# Patient Record
Sex: Female | Born: 1985 | Race: White | Hispanic: No | Marital: Single | Smoking: Never smoker
Health system: Southern US, Community
[De-identification: ages and names within clinical notes are randomized; demographics above are authoritative.]

## PROBLEM LIST (undated history)

## (undated) DIAGNOSIS — D649 Anemia, unspecified: Secondary | ICD-10-CM

## (undated) HISTORY — DX: Anemia, unspecified: D64.9

## (undated) HISTORY — PX: FIBULA FRACTURE SURGERY: SHX947

## (undated) HISTORY — PX: EYE SURGERY: SHX253

---

## 2005-11-26 ENCOUNTER — Ambulatory Visit (HOSPITAL_COMMUNITY): Admission: RE | Admit: 2005-11-26 | Discharge: 2005-11-26 | Payer: Self-pay | Admitting: Orthopedic Surgery

## 2006-07-13 ENCOUNTER — Ambulatory Visit (HOSPITAL_BASED_OUTPATIENT_CLINIC_OR_DEPARTMENT_OTHER): Admission: RE | Admit: 2006-07-13 | Discharge: 2006-07-13 | Payer: Self-pay | Admitting: Orthopedic Surgery

## 2008-03-23 ENCOUNTER — Ambulatory Visit: Payer: Self-pay | Admitting: Family Medicine

## 2008-03-23 DIAGNOSIS — D649 Anemia, unspecified: Secondary | ICD-10-CM | POA: Insufficient documentation

## 2008-03-26 ENCOUNTER — Encounter (INDEPENDENT_AMBULATORY_CARE_PROVIDER_SITE_OTHER): Payer: Self-pay | Admitting: *Deleted

## 2008-03-26 LAB — CONVERTED CEMR LAB
Basophils Relative: 0 % (ref 0–1)
Eosinophils Relative: 1 % (ref 0–5)
Folate: 14.1 ng/mL
HCT: 38.6 % (ref 36.0–46.0)
Hemoglobin: 12 g/dL (ref 12.0–15.0)
MCHC: 31.1 g/dL (ref 30.0–36.0)
Monocytes Absolute: 0.6 10*3/uL (ref 0.1–1.0)
Monocytes Relative: 7 % (ref 3–12)
Neutro Abs: 5.9 10*3/uL (ref 1.7–7.7)
RBC: 5.26 M/uL — ABNORMAL HIGH (ref 3.87–5.11)
Transferrin: 300 mg/dL (ref 212–360)

## 2008-04-10 ENCOUNTER — Ambulatory Visit: Payer: Self-pay | Admitting: Family Medicine

## 2008-04-10 ENCOUNTER — Encounter (INDEPENDENT_AMBULATORY_CARE_PROVIDER_SITE_OTHER): Payer: Self-pay | Admitting: *Deleted

## 2008-04-10 LAB — CONVERTED CEMR LAB
OCCULT 2: NEGATIVE
OCCULT 3: NEGATIVE

## 2011-02-19 ENCOUNTER — Emergency Department (INDEPENDENT_AMBULATORY_CARE_PROVIDER_SITE_OTHER): Payer: No Typology Code available for payment source

## 2011-02-19 ENCOUNTER — Emergency Department (HOSPITAL_BASED_OUTPATIENT_CLINIC_OR_DEPARTMENT_OTHER)
Admission: EM | Admit: 2011-02-19 | Discharge: 2011-02-19 | Disposition: A | Discharge status: Home / Self Care | Payer: No Typology Code available for payment source | Attending: Emergency Medicine | Admitting: Emergency Medicine

## 2011-02-19 DIAGNOSIS — R55 Syncope and collapse: Secondary | ICD-10-CM

## 2011-02-19 LAB — DIFFERENTIAL
Basophils Relative: 0 % (ref 0–1)
Eosinophils Absolute: 0 10*3/uL (ref 0.0–0.7)
Lymphocytes Relative: 8 % — ABNORMAL LOW (ref 12–46)
Monocytes Relative: 4 % (ref 3–12)
Neutro Abs: 9.8 10*3/uL — ABNORMAL HIGH (ref 1.7–7.7)

## 2011-02-19 LAB — COMPREHENSIVE METABOLIC PANEL
Albumin: 4.5 g/dL (ref 3.5–5.2)
Alkaline Phosphatase: 84 U/L (ref 39–117)
BUN: 16 mg/dL (ref 6–23)
GFR calc Af Amer: >60 mL/min (ref >60)
Potassium: 4.5 mEq/L (ref 3.5–5.1)
Total Protein: 8.5 g/dL — ABNORMAL HIGH (ref 6.0–8.3)

## 2011-02-19 LAB — CBC
Hemoglobin: 12.6 g/dL (ref 12.0–15.0)
MCH: 23.6 pg — ABNORMAL LOW (ref 26.0–34.0)
MCV: 71.2 fL — ABNORMAL LOW (ref 78.0–100.0)
RBC: 5.34 MIL/uL — ABNORMAL HIGH (ref 3.87–5.11)

## 2011-02-19 LAB — URINE MICROSCOPIC-ADD ON

## 2011-02-19 LAB — URINALYSIS, ROUTINE W REFLEX MICROSCOPIC
Bilirubin Urine: NEGATIVE
Specific Gravity, Urine: 1.027 (ref 1.005–1.030)
pH: 5.5 (ref 5.0–8.0)

## 2011-02-19 MED ORDER — IOHEXOL 350 MG/ML SOLN
80.0000 mL | Freq: Once | INTRAVENOUS | Status: AC | PRN
  Administered 2011-02-19: 80 mL via INTRAVENOUS

## 2011-04-24 NOTE — Op Note (Signed)
NAMEMarland Kitchen  SAVAYAH, WALTRIP NO.:  [id number]   MEDICAL RECORD NO.:  [id number]          PATIENT TYPE:  AMB   LOCATION:  DAY                          FACILITY:  [facility]   PHYSICIAN:  Leonides Grills, M.D.     DATE OF BIRTH:  03-01-1986   DATE OF PROCEDURE:  11/26/2005  DATE OF DISCHARGE:                                 OPERATIVE REPORT   PREOPERATIVE DIAGNOSES:  1.  Right closed lateral malleolus fracture.  2.  Right distal tib-fib syndesmotic rupture.   POSTOPERATIVE DIAGNOSES:  1.  Right closed lateral malleolus fracture.  2.  Right distal tib-fib syndesmotic rupture.   OPERATION:  1.  Open reduction and internal fixation right lateral malleolus fracture.  2.  Open reduction and internal fixation distal tib-fib syndesmotic rupture.  3.  Stress x-rays right ankle.   ANESTHESIA:  General.   SURGEON:  Leonides Grills, M.D.   ASSISTANT:  Lianne Cure, P.A.   ESTIMATED BLOOD LOSS:  Minimal.   TOURNIQUET TIME:  None.   COMPLICATIONS:  None.   DISPOSITION:  Stable to PR.   INDICATIONS FOR PROCEDURE:  This is a 25 year old female who slipped and  fell this week and sustained the above injury. She was consented to the  above procedure. All risks which include infection, neurovessel injury,  nonunion, malunion, hardware rotation, hardware failure, persistent pain,  worsening pain, stiffness, arthritis, prolonged recovery, possibility  syndesmotic ligaments may require refixation and future hardware removal of  the syndesmotic screws were all explained, questions were encouraged and  answered.   DESCRIPTION OF PROCEDURE:  The patient was brought to the operating room,  placed in supine position. After adequate general endotracheal tube  anesthesia was administered as well as Ancef 1 gram IV piggyback. The right  lower extremity was then prepped and draped in a sterile manner. A  tourniquet was applied but was not used due to a venous tourniquet. The  right lower  extremity was then prepped and draped in a sterile manner. A  longitudinal incision over the lateral malleolus was then made and  dissection was carried down through the skin, hemostasis was obtained. A  fracture site was then encountered. A 10 hole 1/3 tubular plate was applied.  With distraction on the distal fragment, the fibula was pulled out to length  and keyed in anatomically. This required two 2 point Weber reduction clamps.  Once this was anatomic, we applied the 10 hole 1/3 tubular plate and applied  four 3.5 mm fully threaded cortical set screws proximally and two distally.  We then placed two interfragmentary lag screws using a 3.5,  2.5 mm drill  hole respectively. This had excellent purchase and maintenance of the  reduction. We then applied a Kings tong clamp and anatomically reduced  openly the distal tib-fib syndesmosis and then placed two 4.5 mm fully  threaded cortical set screws using a 3.2 mm drill hole respectively with the  ankle in full dorsiflexion. This had excellent purchase __________  correction especially after the [facility] tong clamp was taken off. Of note, we  obtained stress x-rays after  the fibula was fixed and there was clear  widening of the distal tib-fib syndesmosis and this was __________ pattern  as well. We elected to place the syndesmotic screws as well. Final stress x-  rays were obtained AP, lateral and mortis views and showed an anatomic  reduction. Fixation and proposition, no gross motion of the tails within the  ankle mortis especially laterally and no diastasis of the distal tib-fib  syndesmosis. Of note, as stated earlier, the tourniquet was inflated for  about 2 or 3 minutes, there was a venous tourniquet and we deflated it and  we had excellent control of hemostasis throughout the procedure. The wound  was copiously irrigated with normal saline. The subcu was closed with 2-0  Vicryl, skin was closed with 4-0 nylon. A sterile dressing was  applied. A  modified Jones dressing was applied with the ankle in neutral dorsiflexion.  The patient was stable to the PR.      Leonides Grills, M.D.  Electronically Signed     PB/MEDQ  D:  11/26/2005  T:  12/01/2005  Job:  [id number]

## 2011-04-24 NOTE — Op Note (Signed)
NAMEMarland Kitchen  Kelsey, Munoz NO.:  [id number]   MEDICAL RECORD NO.:  [id number]          PATIENT TYPE:  AMB   LOCATION:  DSC                          FACILITY:  [facility]   PHYSICIAN:  Leonides Grills, M.D.     DATE OF BIRTH:  1986-03-09   DATE OF PROCEDURE:  07/13/2006  DATE OF DISCHARGE:                                 OPERATIVE REPORT   SURGEON:  Leonides Grills, M.D.   ASSISTANT:  None.   PREOPERATIVE DIAGNOSIS:  Complication of hardware, right ankle.   POSTOPERATIVE DIAGNOSIS:  Complication of hardware, right ankle.   OPERATION:  1.  Hardware removal, deep, right ankle.  2.  Stress x-rays, right ankle.   ANESTHESIA:  General.   COMPLICATIONS:  None.   DISPOSITION:  Stable to the PAR.   INDICATIONS:  This is a 25 year old female, who is approximately now 5  months status post open reduction, internal fixation of a complex pronation-  type ankle fracture with syndesmotic fixation.  She now presents for  syndesmotic screw removal.  She was consented for the above procedure.  All  risks, which include infection, nerve or vessel injury, possible syndesmotic  instability, and persistent pain, and possible refixation, stiffness,  arthritis, persistent or worsening pain, and prolonged recovery, possibility  of hardware irritation or hardware failure, were all explained.  Questions  were encouraged and answered.   DESCRIPTION OF PROCEDURE:  The patient was brought to the operating room and  placed in supine position.  After adequate general endotracheal tube  anesthesia was administered, as well as Ancef 1 g IV piggyback, the patient  was then placed in slight internal rotation with a bump under her right  ipsilateral hip.  Her right lower extremity was then prepped and draped in a  sterile manner.  No tourniquet was used.  Under C-arm guidance, the screws  were identified.  Midline between the two screws, a small incision was made  through the previous incision.   Dissection was carried down directly to  screw and bone.  Soft tissues were elevated on either side of the screws,  and both screws were removed with a large-frag screwdriver.  The proximal  screw was broken the tibia-fibular interface.  The area was copiously  irrigated with normal saline.  Hemostasis was obtained.  Stress x-rays were  obtained, A/P,  with and without stressed external rotation, showed stability and not  translation of the talus of syndesmotic clear-space widening.  The wounds  were closed with 4-0 nylon suture.  Sterile dressing was applied and the  patient was stable to the PAR.      Leonides Grills, M.D.  Electronically Signed     PB/MEDQ  D:  07/13/2006  T:  07/13/2006  Job:  [id number]

## 2011-06-15 ENCOUNTER — Encounter: Payer: Self-pay | Admitting: Family Medicine

## 2011-06-16 ENCOUNTER — Encounter: Payer: Self-pay | Admitting: Family Medicine

## 2011-06-16 ENCOUNTER — Ambulatory Visit (HOSPITAL_BASED_OUTPATIENT_CLINIC_OR_DEPARTMENT_OTHER)
Admission: RE | Admit: 2011-06-16 | Discharge: 2011-06-16 | Disposition: A | Discharge status: Home / Self Care | Payer: No Typology Code available for payment source | Source: Ambulatory Visit | Attending: Family Medicine | Admitting: Family Medicine

## 2011-06-16 ENCOUNTER — Ambulatory Visit (INDEPENDENT_AMBULATORY_CARE_PROVIDER_SITE_OTHER): Payer: No Typology Code available for payment source | Admitting: Family Medicine

## 2011-06-16 VITALS — BP 124/80 | HR 96 | Temp 98.9°F | Wt 267.0 lb

## 2011-06-16 DIAGNOSIS — J189 Pneumonia, unspecified organism: Secondary | ICD-10-CM

## 2011-06-16 DIAGNOSIS — J329 Chronic sinusitis, unspecified: Secondary | ICD-10-CM

## 2011-06-16 DIAGNOSIS — J40 Bronchitis, not specified as acute or chronic: Secondary | ICD-10-CM

## 2011-06-16 DIAGNOSIS — R05 Cough: Secondary | ICD-10-CM

## 2011-06-16 DIAGNOSIS — R059 Cough, unspecified: Secondary | ICD-10-CM

## 2011-06-16 MED ORDER — CEFUROXIME AXETIL 500 MG PO TABS: 500.0000 mg | ORAL_TABLET | Freq: Two times a day (BID) | ORAL | Status: AC

## 2011-06-16 MED ORDER — ALBUTEROL SULFATE (2.5 MG/3ML) 0.083% IN NEBU
2.5000 mg | INHALATION_SOLUTION | Freq: Once | RESPIRATORY_TRACT | Status: AC
  Administered 2011-06-16: 2.5 mg via RESPIRATORY_TRACT

## 2011-06-16 NOTE — Progress Notes (Signed)
Addended by: Arnette Norris on: 06/16/2011 04:58 PM   Modules accepted: Orders

## 2011-06-16 NOTE — Progress Notes (Signed)
  Subjective:     Kelsey Munoz is a 25 y.o. female who presents for evaluation of sinus pain. Symptoms include: congestion, cough, facial pain, headaches, nasal congestion, sinus pressure and wheezing... Onset of symptoms was 10 days ago. Symptoms have been gradually worsening since that time. Past history is significant for no history of pneumonia or bronchitis. Patient is a non-smoker.  The following portions of the patient's history were reviewed and updated as appropriate: allergies, current medications, past family history, past medical history, past social history, past surgical history and problem list.  Review of Systems Pertinent items are noted in HPI.   Objective:    BP 124/80  Pulse 96  Temp(Src) 98.9 F (37.2 C) (Oral)  Wt 267 lb (121.11 kg)  SpO2 95% General appearance: alert, cooperative, appears stated age and no distress Ears: normal TM's and external ear canals both ears Nose: Nares normal. Septum midline. Mucosa normal. No drainage or sinus tenderness., mild congestion, sinus tenderness bilateral Throat: lips, mucosa, and tongue normal; teeth and gums normal Lungs: wheezes bilaterally Heart: regular rate and rhythm, S1, S2 normal, no murmur, click, rub or gallop Lymph nodes: Cervical, supraclavicular, and axillary nodes normal.    Assessment:    Acute bacterial sinusitis.   bronchitis Plan:    Neti pot recommended. Instructions given. Nasal steroids per medication orders. Ceftin per medication orders. f/u prn  advair 250/50 1 inh bid  nasonex

## 2011-06-16 NOTE — Progress Notes (Signed)
Addended by: Lelon Perla on: 06/16/2011 04:47 PM   Modules accepted: Orders

## 2011-06-16 NOTE — Patient Instructions (Signed)
Bronchitis Bronchitis is the body's way of reacting to injury and/or infection (inflammation) of the bronchi. Bronchi are the air tubes that extend from the windpipe into the lungs. If the inflammation becomes severe, it may cause shortness of breath.  CAUSES Inflammation may be caused by:  A virus.   Germs (bacteria).   Dust.   Allergens.   Pollutants and many other irritants.  The cells lining the bronchial tree are covered with tiny hairs (cilia). These constantly beat upward, away from the lungs, toward the mouth. This keeps the lungs free of pollutants. When these cells become too irritated and are unable to do their job, mucus begins to develop. This causes the characteristic cough of bronchitis. The cough clears the lungs when the cilia are unable to do their job. Without either of these protective mechanisms, the mucus would settle in the lungs. Then you would develop pneumonia. Smoking is a common cause of bronchitis and can contribute to pneumonia. Stopping this habit is the single most important thing you can do to help yourself. TREATMENT  Your caregiver may prescribe an antibiotic if the cough is caused by bacteria. Also, medicines that open up your airways make it easier to breathe. Your caregiver may also recommend or prescribe an expectorant. It will loosen the mucus to be coughed up. Only take over-the-counter or prescription medicines for pain, discomfort, or fever as directed by your caregiver.   Removing whatever causes the problem (smoking, for example) is critical to preventing the problem from getting worse.   Cough suppressants may be prescribed for relief of cough symptoms.   Inhaled medicines may be prescribed to help with symptoms now and to help prevent problems from returning.   For those with recurrent (chronic) bronchitis, there may be a need for steroid medicines.  SEEK IMMEDIATE MEDICAL CARE IF:  During treatment, you develop more pus-like mucus  (purulent sputum).   You or your child has an oral temperature above 100.4, not controlled by medicine.   Your baby is older than 3 months with a rectal temperature of 102 F (38.9 C) or higher.   Your baby is 3 months old or younger with a rectal temperature of 100.4 F (38 C) or higher.   You become progressively more ill.   You have increased difficulty breathing, wheezing, or shortness of breath.  It is necessary to seek immediate medical care if you are elderly or sick from any other disease. MAKE SURE YOU:  Understand these instructions.   Will watch your condition.   Will get help right away if you are not doing well or get worse.  Document Released: 11/23/2005 Document Re-Released: 02/17/2010 [employer] Patient Information 2011 [employer]. 

## 2011-06-18 ENCOUNTER — Telehealth: Payer: Self-pay | Admitting: Family Medicine

## 2011-06-18 NOTE — Telephone Encounter (Signed)
Pt states that cough is still persistent but it is not increased during the day or the night. Pt uses [facility] [address] Please advise

## 2011-06-18 NOTE — Telephone Encounter (Signed)
cheratussin 6 oz 1-2 tsp po qhs prn cough----use mucinex or mucinex Dm

## 2011-06-18 NOTE — Telephone Encounter (Signed)
Patient was seen [id number] - she was told to call back if not any better - she is still coughing

## 2011-06-19 MED ORDER — GUAIFENESIN-CODEINE 100-10 MG/5ML PO SYRP: ORAL_SOLUTION | ORAL | Status: DC

## 2011-06-19 NOTE — Telephone Encounter (Signed)
Pt aware of recommendations

## 2011-06-24 ENCOUNTER — Telehealth: Payer: Self-pay | Admitting: Family Medicine

## 2011-06-24 NOTE — Telephone Encounter (Signed)
please advise.

## 2011-06-25 ENCOUNTER — Encounter: Payer: Self-pay | Admitting: *Deleted

## 2011-06-25 NOTE — Telephone Encounter (Signed)
Pt walked in to office note printed and given to Pt.

## 2011-06-25 NOTE — Telephone Encounter (Signed)
Ok to give note 

## 2011-07-20 ENCOUNTER — Encounter: Payer: Self-pay | Admitting: Family Medicine

## 2011-07-20 ENCOUNTER — Ambulatory Visit (INDEPENDENT_AMBULATORY_CARE_PROVIDER_SITE_OTHER): Payer: No Typology Code available for payment source | Admitting: Family Medicine

## 2011-07-20 VITALS — BP 120/70 | HR 81 | Temp 99.3°F | Wt 264.0 lb

## 2011-07-20 DIAGNOSIS — J40 Bronchitis, not specified as acute or chronic: Secondary | ICD-10-CM

## 2011-07-20 MED ORDER — MOMETASONE FURO-FORMOTEROL FUM 100-5 MCG/ACT IN AERO: 2.0000 | INHALATION_SPRAY | Freq: Two times a day (BID) | RESPIRATORY_TRACT | Status: DC

## 2011-07-20 MED ORDER — AZITHROMYCIN 250 MG PO TABS: ORAL_TABLET | ORAL | Status: AC

## 2011-07-20 MED ORDER — ALBUTEROL SULFATE HFA 108 (90 BASE) MCG/ACT IN AERS: 2.0000 | INHALATION_SPRAY | Freq: Four times a day (QID) | RESPIRATORY_TRACT | Status: DC | PRN

## 2011-07-20 NOTE — Progress Notes (Signed)
  Subjective:     Kelsey Munoz is a 25 y.o. female here for evaluation of a cough. Onset of symptoms was 2 weeks ago. Symptoms have been gradually worsening since that time. The cough is productive and is aggravated by exercise. Associated symptoms include: shortness of breath, sputum production and wheezing. Patient does not have a history of asthma. Patient does have a history of environmental allergens. Patient has not traveled recently. Patient does not have a history of smoking. Patient has not had a previous chest x-ray. Patient has not had a PPD done.  The following portions of the patient's history were reviewed and updated as appropriate: allergies, current medications, past family history, past medical history, past social history, past surgical history and problem list.  Review of Systems Pertinent items are noted in HPI.    Objective:    Oxygen saturation 98% on room air Head: Normocephalic, without obvious abnormality, atraumatic   cor-- +S1S2 Lung--dec BS b/l  Assessment:    Acute Bronchitis    Plan:    Antibiotics per medication orders. Antitussives per medication orders. Avoid exposure to tobacco smoke and fumes. B-agonist inhaler. Call if shortness of breath worsens, blood in sputum, change in character of cough, development of fever or chills, inability to maintain nutrition and hydration. Avoid exposure to tobacco smoke and fumes. Follow-up in 3 days, or sooner as needed. Steroid inhaler as ordered.

## 2011-07-20 NOTE — Patient Instructions (Signed)
Bronchitis Bronchitis is the body's way of reacting to injury and/or infection (inflammation) of the bronchi. Bronchi are the air tubes that extend from the windpipe into the lungs. If the inflammation becomes severe, it may cause shortness of breath.  CAUSES Inflammation may be caused by:  A virus.   Germs (bacteria).   Dust.   Allergens.   Pollutants and many other irritants.  The cells lining the bronchial tree are covered with tiny hairs (cilia). These constantly beat upward, away from the lungs, toward the mouth. This keeps the lungs free of pollutants. When these cells become too irritated and are unable to do their job, mucus begins to develop. This causes the characteristic cough of bronchitis. The cough clears the lungs when the cilia are unable to do their job. Without either of these protective mechanisms, the mucus would settle in the lungs. Then you would develop pneumonia. Smoking is a common cause of bronchitis and can contribute to pneumonia. Stopping this habit is the single most important thing you can do to help yourself. TREATMENT  Your caregiver may prescribe an antibiotic if the cough is caused by bacteria. Also, medicines that open up your airways make it easier to breathe. Your caregiver may also recommend or prescribe an expectorant. It will loosen the mucus to be coughed up. Only take over-the-counter or prescription medicines for pain, discomfort, or fever as directed by your caregiver.   Removing whatever causes the problem (smoking, for example) is critical to preventing the problem from getting worse.   Cough suppressants may be prescribed for relief of cough symptoms.   Inhaled medicines may be prescribed to help with symptoms now and to help prevent problems from returning.   For those with recurrent (chronic) bronchitis, there may be a need for steroid medicines.  SEEK IMMEDIATE MEDICAL CARE IF:  During treatment, you develop more pus-like mucus  (purulent sputum).   You or your child has an oral temperature above 100.4, not controlled by medicine.   Your baby is older than 3 months with a rectal temperature of 102 F (38.9 C) or higher.   Your baby is 3 months old or younger with a rectal temperature of 100.4 F (38 C) or higher.   You become progressively more ill.   You have increased difficulty breathing, wheezing, or shortness of breath.  It is necessary to seek immediate medical care if you are elderly or sick from any other disease. MAKE SURE YOU:  Understand these instructions.   Will watch your condition.   Will get help right away if you are not doing well or get worse.  Document Released: 11/23/2005 Document Re-Released: 02/17/2010 [employer] Patient Information 2011 [employer]. 

## 2011-12-30 ENCOUNTER — Ambulatory Visit (HOSPITAL_BASED_OUTPATIENT_CLINIC_OR_DEPARTMENT_OTHER)
Admission: RE | Admit: 2011-12-30 | Discharge: 2011-12-30 | Disposition: A | Discharge status: Home / Self Care | Payer: No Typology Code available for payment source | Source: Ambulatory Visit | Attending: Family Medicine | Admitting: Family Medicine

## 2011-12-30 ENCOUNTER — Ambulatory Visit (INDEPENDENT_AMBULATORY_CARE_PROVIDER_SITE_OTHER): Payer: No Typology Code available for payment source | Admitting: Family Medicine

## 2011-12-30 ENCOUNTER — Encounter: Payer: Self-pay | Admitting: Family Medicine

## 2011-12-30 VITALS — BP 114/74 | HR 62 | Temp 99.1°F | Wt 258.2 lb

## 2011-12-30 DIAGNOSIS — R0602 Shortness of breath: Secondary | ICD-10-CM

## 2011-12-30 NOTE — Progress Notes (Signed)
  Subjective:    Patient ID: Kelsey Munoz, female    DOB: 10/19/1986, 26 y.o.   MRN: [id number]  HPI Pt here c/o feeling like there is a band around her chest.  No chest pain ,   No cough, fever etc.  Pt was using dulera but it ran out.  She has used ventolin 2x since Christmas.  Pt was sick in Nov/Dec but she was in [place] with [place].  She took otc and it went away.   Review of Systems as above   Objective:   Physical Exam  Constitutional: She is oriented to person, place, and time. She appears well-developed and well-nourished.  Cardiovascular: Normal rate and regular rhythm.   Pulmonary/Chest: Effort normal and breath sounds normal. No respiratory distress. She has no wheezes. She has no rales. She exhibits no tenderness.       Decrease Breath sounds  Neurological: She is alert and oriented to person, place, and time.  Psychiatric: She has a normal mood and affect. Her behavior is normal. Judgment and thought content normal.          Assessment & Plan:  Bronchospasm---- refill inhalers---- rto for PFTS

## 2011-12-30 NOTE — Patient Instructions (Signed)

## 2012-01-13 ENCOUNTER — Encounter: Payer: Self-pay | Admitting: Family Medicine

## 2012-01-13 ENCOUNTER — Ambulatory Visit (INDEPENDENT_AMBULATORY_CARE_PROVIDER_SITE_OTHER): Payer: No Typology Code available for payment source | Admitting: Family Medicine

## 2012-01-13 VITALS — BP 116/72 | HR 67 | Temp 98.7°F | Ht 66.0 in | Wt 259.0 lb

## 2012-01-13 DIAGNOSIS — R0602 Shortness of breath: Secondary | ICD-10-CM

## 2012-01-13 NOTE — Progress Notes (Signed)
  Subjective:    Patient ID: Kelsey Munoz, female    DOB: [birth year], 26 y.o.   MRN: [id number]  HPI  Pt doing well with symbicort and has not needed albuterol at all. No new complaints.  Pt here for pft.  Review of Systems As above    Objective:   Physical Exam  Constitutional: She is oriented to person, place, and time. She appears well-developed and well-nourished.  Cardiovascular: Normal rate, regular rhythm and normal heart sounds.   No murmur heard. Pulmonary/Chest: Effort normal and breath sounds normal. No respiratory distress. She has no wheezes. She has no rales. She exhibits no tenderness.  Neurological: She is alert and oriented to person, place, and time.  Psychiatric: She has a normal mood and affect. Her behavior is normal.          Assessment & Plan:  S/P bronchitis------ PFT normal with use of symbicort

## 2013-01-21 ENCOUNTER — Other Ambulatory Visit: Payer: Self-pay

## 2013-08-30 ENCOUNTER — Telehealth: Payer: Self-pay

## 2013-08-30 NOTE — Telephone Encounter (Signed)
LVM for CB  HM not UTD? Need records from another office?

## 2013-08-30 NOTE — Telephone Encounter (Signed)
HM-Due for PAP, Tdap and Flu vaccine Currently takes no prescription medications.  Reconciled meds, verified pharmacy and allergies

## 2013-08-31 ENCOUNTER — Other Ambulatory Visit (HOSPITAL_COMMUNITY)
Admission: RE | Admit: 2013-08-31 | Discharge: 2013-08-31 | Disposition: A | Discharge status: Home / Self Care | Payer: No Typology Code available for payment source | Source: Ambulatory Visit | Attending: Family Medicine | Admitting: Family Medicine

## 2013-08-31 ENCOUNTER — Encounter: Payer: Self-pay | Admitting: Family Medicine

## 2013-08-31 ENCOUNTER — Ambulatory Visit (INDEPENDENT_AMBULATORY_CARE_PROVIDER_SITE_OTHER): Payer: No Typology Code available for payment source | Admitting: Family Medicine

## 2013-08-31 VITALS — BP 116/72 | HR 66 | Temp 98.1°F | Ht 66.0 in | Wt 243.0 lb

## 2013-08-31 DIAGNOSIS — Z23 Encounter for immunization: Secondary | ICD-10-CM

## 2013-08-31 DIAGNOSIS — E669 Obesity, unspecified: Secondary | ICD-10-CM | POA: Insufficient documentation

## 2013-08-31 DIAGNOSIS — Z1151 Encounter for screening for human papillomavirus (HPV): Secondary | ICD-10-CM | POA: Insufficient documentation

## 2013-08-31 DIAGNOSIS — Z0000 Encounter for general adult medical examination without abnormal findings: Secondary | ICD-10-CM

## 2013-08-31 DIAGNOSIS — Z124 Encounter for screening for malignant neoplasm of cervix: Secondary | ICD-10-CM

## 2013-08-31 DIAGNOSIS — Z01419 Encounter for gynecological examination (general) (routine) without abnormal findings: Secondary | ICD-10-CM | POA: Insufficient documentation

## 2013-08-31 LAB — CBC WITH DIFFERENTIAL/PLATELET
Basophils Absolute: 0 10*3/uL (ref 0.0–0.1)
Eosinophils Absolute: 0.1 10*3/uL (ref 0.0–0.7)
Hemoglobin: 12.8 g/dL (ref 12.0–15.0)
Lymphocytes Relative: 26.7 % (ref 12.0–46.0)
Lymphs Abs: 1.8 10*3/uL (ref 0.7–4.0)
MCHC: 33.4 g/dL (ref 30.0–36.0)
Monocytes Relative: 6.1 % (ref 3.0–12.0)
Neutro Abs: 4.4 10*3/uL (ref 1.4–7.7)
Platelets: 238 10*3/uL (ref 150.0–400.0)
RDW: 14.1 % (ref 11.5–14.6)
WBC: 6.8 10*3/uL (ref 4.5–10.5)

## 2013-08-31 LAB — HEPATIC FUNCTION PANEL
Albumin: 4.1 g/dL (ref 3.5–5.2)
Alkaline Phosphatase: 57 U/L (ref 39–117)
Total Bilirubin: 0.6 mg/dL (ref 0.3–1.2)

## 2013-08-31 LAB — LIPID PANEL
Cholesterol: 251 mg/dL — ABNORMAL HIGH (ref 0–200)
HDL: 49 mg/dL (ref >39.00)
Total CHOL/HDL Ratio: 5
VLDL: 21 mg/dL (ref 0.0–40.0)

## 2013-08-31 LAB — BASIC METABOLIC PANEL
Chloride: 105 mEq/L (ref 96–112)
Creatinine, Ser: 0.8 mg/dL (ref 0.4–1.2)
Potassium: 3.9 mEq/L (ref 3.5–5.1)

## 2013-08-31 NOTE — Progress Notes (Signed)
Subjective:     Kelsey Munoz is a 27 y.o. female and is here for a comprehensive physical exam. The patient reports no problems.  History   Social History  . Marital Status: Single    Spouse Name: N/A    Number of Children: N/A  . Years of Education: N/A   Occupational History  . 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 Smoking status: Never Smoker   . Smokeless tobacco: Not on file  . Alcohol Use: No  . Drug Use: No  . Sexual Activity: No   Other Topics Concern  . Not on file   Social History Narrative   Exercise-- walking 5 miles a day at work   Health Maintenance  Topic Date Due  . Pap Smear  05/10/2004  . Influenza Vaccine  07/07/2013  . Tetanus/tdap  12/07/2014    The following portions of the patient's history were reviewed and updated as appropriate:  She  has a past medical history of Anemia. She  does not have any pertinent problems on file. She  has no past surgical history on file. Her family history includes Diabetes in her father and mother; Hyperlipidemia in her father; Hypertension in her father and mother. She  reports that she has never smoked. She does not have any smokeless tobacco history on file. She reports that she does not drink alcohol or use illicit drugs. She has a current medication list which includes the following prescription(s): albuterol and multivitamin. Current Outpatient Prescriptions on File Prior to Visit  Medication Sig Dispense Refill  . albuterol (VENTOLIN HFA) 108 (90 BASE) MCG/ACT inhaler Inhale 2 puffs into the lungs every 6 (six) hours as needed for wheezing.  1 Inhaler  0  . Multiple Vitamin (MULTIVITAMIN) tablet Take 1 tablet by mouth daily.       No current facility-administered medications on file prior to visit.   She has No Known Allergies..  Review of Systems Review of Systems  Constitutional: Negative for activity change, appetite change and fatigue.  HENT: Negative for  hearing loss, congestion, tinnitus and ear discharge.  dentist q56m Eyes: Negative for visual disturbance (see optho q1y -- vision corrected to 20/20 with glasses).  Respiratory: Negative for cough, chest tightness and shortness of breath.   Cardiovascular: Negative for chest pain, palpitations and leg swelling.  Gastrointestinal: Negative for abdominal pain, diarrhea, constipation and abdominal distention.  Genitourinary: Negative for urgency, frequency, decreased urine volume and difficulty urinating.  Musculoskeletal: Negative for back pain, arthralgias and gait problem.  Skin: Negative for color change, pallor and rash.  Neurological: Negative for dizziness, light-headedness, numbness and headaches.  Hematological: Negative for adenopathy. Does not bruise/bleed easily.  Psychiatric/Behavioral: Negative for suicidal ideas, confusion, sleep disturbance, self-injury, dysphoric mood, decreased concentration and agitation.       Objective:    BP 116/72  Pulse 66  Temp(Src) 98.1 F (36.7 C) (Oral)  Ht 5\' 6"  (1.676 m)  Wt 243 lb (110.224 kg)  BMI 39.24 kg/m2  SpO2 98%  LMP 08/23/2013 General appearance: alert, cooperative, appears stated age and no distress Head: Normocephalic, without obvious abnormality, atraumatic Eyes: conjunctivae/corneas clear. PERRL, EOM's intact. Fundi benign. Ears: normal TM's and external ear canals both ears Nose: Nares normal. Septum midline. Mucosa normal. No drainage or sinus tenderness. Throat: lips, mucosa, and tongue normal; teeth and gums normal Neck: no adenopathy, no carotid bruit, no JVD, supple, symmetrical, trachea midline and thyroid not enlarged,  symmetric, no tenderness/mass/nodules Back: symmetric, no curvature. ROM normal. No CVA tenderness. Lungs: clear to auscultation bilaterally Breasts: normal appearance, no masses or tenderness Heart: regular rate and rhythm, S1, S2 normal, no murmur, click, rub or gallop Abdomen: soft, non-tender;  bowel sounds normal; no masses,  no organomegaly Pelvic: cervix normal in appearance, external genitalia normal, no adnexal masses or tenderness, no cervical motion tenderness, rectovaginal septum normal, uterus normal size, shape, and consistency and vagina normal without discharge ====pap done Extremities: extremities normal, atraumatic, no cyanosis or edema Pulses: 2+ and symmetric Skin: Skin color, texture, turgor normal. No rashes or lesions Lymph nodes: Cervical, supraclavicular, and axillary nodes normal. Neurologic: Alert and oriented X 3, normal strength and tone. Normal symmetric reflexes. Normal coordination and gait Psych-- no depression, no anxiety      Assessment:    Healthy female exam.       Plan:     ghm utd Check labs See After Visit Summary for Counseling Recommendations

## 2013-08-31 NOTE — Patient Instructions (Signed)
Preventive Care for Adults, Female A healthy lifestyle and preventive care can promote health and wellness. Preventive health guidelines for women include the following key practices.  A routine yearly physical is a good way to check with your caregiver about your health and preventive screening. It is a chance to share any concerns and updates on your health, and to receive a thorough exam.  Visit your dentist for a routine exam and preventive care every 6 months. Brush your teeth twice a day and floss once a day. Good oral hygiene prevents tooth decay and gum disease.  The frequency of eye exams is based on your age, health, family medical history, use of contact lenses, and other factors. Follow your caregiver's recommendations for frequency of eye exams.  Eat a healthy diet. Foods like vegetables, fruits, whole grains, low-fat dairy products, and lean protein foods contain the nutrients you need without too many calories. Decrease your intake of foods high in solid fats, added sugars, and salt. Eat the right amount of calories for you.Get information about a proper diet from your caregiver, if necessary.  Regular physical exercise is one of the most important things you can do for your health. Most adults should get at least 150 minutes of moderate-intensity exercise (any activity that increases your heart rate and causes you to sweat) each week. In addition, most adults need muscle-strengthening exercises on 2 or more days a week.  Maintain a healthy weight. The body mass index (BMI) is a screening tool to identify possible weight problems. It provides an estimate of body fat based on height and weight. Your caregiver can help determine your BMI, and can help you achieve or maintain a healthy weight.For adults 20 years and older:  A BMI below 18.5 is considered underweight.  A BMI of 18.5 to 24.9 is normal.  A BMI of 25 to 29.9 is considered overweight.  A BMI of 30 and above is  considered obese.  Maintain normal blood lipids and cholesterol levels by exercising and minimizing your intake of saturated fat. Eat a balanced diet with plenty of fruit and vegetables. Blood tests for lipids and cholesterol should begin at age 20 and be repeated every 5 years. If your lipid or cholesterol levels are high, you are over 50, or you are at high risk for heart disease, you may need your cholesterol levels checked more frequently.Ongoing high lipid and cholesterol levels should be treated with medicines if diet and exercise are not effective.  If you smoke, find out from your caregiver how to quit. If you do not use tobacco, do not start.  If you are pregnant, do not drink alcohol. If you are breastfeeding, be very cautious about drinking alcohol. If you are not pregnant and choose to drink alcohol, do not exceed 1 drink per day. One drink is considered to be 12 ounces (355 mL) of beer, 5 ounces (148 mL) of wine, or 1.5 ounces (44 mL) of liquor.  Avoid use of street drugs. Do not share needles with anyone. Ask for help if you need support or instructions about stopping the use of drugs.  High blood pressure causes heart disease and increases the risk of stroke. Your blood pressure should be checked at least every 1 to 2 years. Ongoing high blood pressure should be treated with medicines if weight loss and exercise are not effective.  If you are 55 to 27 years old, ask your caregiver if you should take aspirin to prevent strokes.  Diabetes   screening involves taking a blood sample to check your fasting blood sugar level. This should be done once every 3 years, after age 45, if you are within normal weight and without risk factors for diabetes. Testing should be considered at a younger age or be carried out more frequently if you are overweight and have at least 1 risk factor for diabetes.  Breast cancer screening is essential preventive care for women. You should practice "breast  self-awareness." This means understanding the normal appearance and feel of your breasts and may include breast self-examination. Any changes detected, no matter how small, should be reported to a caregiver. Women in their 20s and 30s should have a clinical breast exam (CBE) by a caregiver as part of a regular health exam every 1 to 3 years. After age 40, women should have a CBE every year. Starting at age 40, women should consider having a mammography (breast X-ray test) every year. Women who have a family history of breast cancer should talk to their caregiver about genetic screening. Women at a high risk of breast cancer should talk to their caregivers about having magnetic resonance imaging (MRI) and a mammography every year.  The Pap test is a screening test for cervical cancer. A Pap test can show cell changes on the cervix that might become cervical cancer if left untreated. A Pap test is a procedure in which cells are obtained and examined from the lower end of the uterus (cervix).  Women should have a Pap test starting at age 21.  Between ages 21 and 29, Pap tests should be repeated every 2 years.  Beginning at age 30, you should have a Pap test every 3 years as long as the past 3 Pap tests have been normal.  Some women have medical problems that increase the chance of getting cervical cancer. Talk to your caregiver about these problems. It is especially important to talk to your caregiver if a new problem develops soon after your last Pap test. In these cases, your caregiver may recommend more frequent screening and Pap tests.  The above recommendations are the same for women who have or have not gotten the vaccine for human papillomavirus (HPV).  If you had a hysterectomy for a problem that was not cancer or a condition that could lead to cancer, then you no longer need Pap tests. Even if you no longer need a Pap test, a regular exam is a good idea to make sure no other problems are  starting.  If you are between ages 65 and 70, and you have had normal Pap tests going back 10 years, you no longer need Pap tests. Even if you no longer need a Pap test, a regular exam is a good idea to make sure no other problems are starting.  If you have had past treatment for cervical cancer or a condition that could lead to cancer, you need Pap tests and screening for cancer for at least 20 years after your treatment.  If Pap tests have been discontinued, risk factors (such as a new sexual partner) need to be reassessed to determine if screening should be resumed.  The HPV test is an additional test that may be used for cervical cancer screening. The HPV test looks for the virus that can cause the cell changes on the cervix. The cells collected during the Pap test can be tested for HPV. The HPV test could be used to screen women aged 30 years and older, and should   be used in women of any age who have unclear Pap test results. After the age of 30, women should have HPV testing at the same frequency as a Pap test.  Colorectal cancer can be detected and often prevented. Most routine colorectal cancer screening begins at the age of 50 and continues through age 75. However, your caregiver may recommend screening at an earlier age if you have risk factors for colon cancer. On a yearly basis, your caregiver may provide home test kits to check for hidden blood in the stool. Use of a small camera at the end of a tube, to directly examine the colon (sigmoidoscopy or colonoscopy), can detect the earliest forms of colorectal cancer. Talk to your caregiver about this at age 50, when routine screening begins. Direct examination of the colon should be repeated every 5 to 10 years through age 75, unless early forms of pre-cancerous polyps or small growths are found.  Hepatitis C blood testing is recommended for all people born from 1945 through 1965 and any individual with known risks for hepatitis C.  Practice  safe sex. Use condoms and avoid high-risk sexual practices to reduce the spread of sexually transmitted infections (STIs). STIs include gonorrhea, chlamydia, syphilis, trichomonas, herpes, HPV, and human immunodeficiency virus (HIV). Herpes, HIV, and HPV are viral illnesses that have no cure. They can result in disability, cancer, and death. Sexually active women aged 25 and younger should be checked for chlamydia. Older women with new or multiple partners should also be tested for chlamydia. Testing for other STIs is recommended if you are sexually active and at increased risk.  Osteoporosis is a disease in which the bones lose minerals and strength with aging. This can result in serious bone fractures. The risk of osteoporosis can be identified using a bone density scan. Women ages 65 and over and women at risk for fractures or osteoporosis should discuss screening with their caregivers. Ask your caregiver whether you should take a calcium supplement or vitamin D to reduce the rate of osteoporosis.  Menopause can be associated with physical symptoms and risks. Hormone replacement therapy is available to decrease symptoms and risks. You should talk to your caregiver about whether hormone replacement therapy is right for you.  Use sunscreen with sun protection factor (SPF) of 30 or more. Apply sunscreen liberally and repeatedly throughout the day. You should seek shade when your shadow is shorter than you. Protect yourself by wearing long sleeves, pants, a wide-brimmed hat, and sunglasses year round, whenever you are outdoors.  Once a month, do a whole body skin exam, using a mirror to look at the skin on your back. Notify your caregiver of new moles, moles that have irregular borders, moles that are larger than a pencil eraser, or moles that have changed in shape or color.  Stay current with required immunizations.  Influenza. You need a dose every fall (or winter). The composition of the flu vaccine  changes each year, so being vaccinated once is not enough.  Pneumococcal polysaccharide. You need 1 to 2 doses if you smoke cigarettes or if you have certain chronic medical conditions. You need 1 dose at age 65 (or older) if you have never been vaccinated.  Tetanus, diphtheria, pertussis (Tdap, Td). Get 1 dose of Tdap vaccine if you are younger than age 65, are over 65 and have contact with an infant, are a healthcare worker, are pregnant, or simply want to be protected from whooping cough. After that, you need a Td   booster dose every 10 years. Consult your caregiver if you have not had at least 3 tetanus and diphtheria-containing shots sometime in your life or have a deep or dirty wound.  HPV. You need this vaccine if you are a woman age 26 or younger. The vaccine is given in 3 doses over 6 months.  Measles, mumps, rubella (MMR). You need at least 1 dose of MMR if you were born in 1957 or later. You may also need a second dose.  Meningococcal. If you are age 19 to 21 and a first-year college student living in a residence hall, or have one of several medical conditions, you need to get vaccinated against meningococcal disease. You may also need additional booster doses.  Zoster (shingles). If you are age 60 or older, you should get this vaccine.  Varicella (chickenpox). If you have never had chickenpox or you were vaccinated but received only 1 dose, talk to your caregiver to find out if you need this vaccine.  Hepatitis A. You need this vaccine if you have a specific risk factor for hepatitis A virus infection or you simply wish to be protected from this disease. The vaccine is usually given as 2 doses, 6 to 18 months apart.  Hepatitis B. You need this vaccine if you have a specific risk factor for hepatitis B virus infection or you simply wish to be protected from this disease. The vaccine is given in 3 doses, usually over 6 months. Preventive Services / Frequency Ages 19 to 39  Blood  pressure check.** / Every 1 to 2 years.  Lipid and cholesterol check.** / Every 5 years beginning at age 20.  Clinical breast exam.** / Every 3 years for women in their 20s and 30s.  Pap test.** / Every 2 years from ages 21 through 29. Every 3 years starting at age 30 through age 65 or 70 with a history of 3 consecutive normal Pap tests.  HPV screening.** / Every 3 years from ages 30 through ages 65 to 70 with a history of 3 consecutive normal Pap tests.  Hepatitis C blood test.** / For any individual with known risks for hepatitis C.  Skin self-exam. / Monthly.  Influenza immunization.** / Every year.  Pneumococcal polysaccharide immunization.** / 1 to 2 doses if you smoke cigarettes or if you have certain chronic medical conditions.  Tetanus, diphtheria, pertussis (Tdap, Td) immunization. / A one-time dose of Tdap vaccine. After that, you need a Td booster dose every 10 years.  HPV immunization. / 3 doses over 6 months, if you are 26 and younger.  Measles, mumps, rubella (MMR) immunization. / You need at least 1 dose of MMR if you were born in 1957 or later. You may also need a second dose.  Meningococcal immunization. / 1 dose if you are age 19 to 21 and a first-year college student living in a residence hall, or have one of several medical conditions, you need to get vaccinated against meningococcal disease. You may also need additional booster doses.  Varicella immunization.** / Consult your caregiver.  Hepatitis A immunization.** / Consult your caregiver. 2 doses, 6 to 18 months apart.  Hepatitis B immunization.** / Consult your caregiver. 3 doses usually over 6 months. Ages 40 to 64  Blood pressure check.** / Every 1 to 2 years.  Lipid and cholesterol check.** / Every 5 years beginning at age 20.  Clinical breast exam.** / Every year after age 40.  Mammogram.** / Every year beginning at age 40   and continuing for as long as you are in good health. Consult with your  caregiver.  Pap test.** / Every 3 years starting at age 30 through age 65 or 70 with a history of 3 consecutive normal Pap tests.  HPV screening.** / Every 3 years from ages 30 through ages 65 to 70 with a history of 3 consecutive normal Pap tests.  Fecal occult blood test (FOBT) of stool. / Every year beginning at age 50 and continuing until age 75. You may not need to do this test if you get a colonoscopy every 10 years.  Flexible sigmoidoscopy or colonoscopy.** / Every 5 years for a flexible sigmoidoscopy or every 10 years for a colonoscopy beginning at age 50 and continuing until age 75.  Hepatitis C blood test.** / For all people born from 1945 through 1965 and any individual with known risks for hepatitis C.  Skin self-exam. / Monthly.  Influenza immunization.** / Every year.  Pneumococcal polysaccharide immunization.** / 1 to 2 doses if you smoke cigarettes or if you have certain chronic medical conditions.  Tetanus, diphtheria, pertussis (Tdap, Td) immunization.** / A one-time dose of Tdap vaccine. After that, you need a Td booster dose every 10 years.  Measles, mumps, rubella (MMR) immunization. / You need at least 1 dose of MMR if you were born in 1957 or later. You may also need a second dose.  Varicella immunization.** / Consult your caregiver.  Meningococcal immunization.** / Consult your caregiver.  Hepatitis A immunization.** / Consult your caregiver. 2 doses, 6 to 18 months apart.  Hepatitis B immunization.** / Consult your caregiver. 3 doses, usually over 6 months. Ages 65 and over  Blood pressure check.** / Every 1 to 2 years.  Lipid and cholesterol check.** / Every 5 years beginning at age 20.  Clinical breast exam.** / Every year after age 40.  Mammogram.** / Every year beginning at age 40 and continuing for as long as you are in good health. Consult with your caregiver.  Pap test.** / Every 3 years starting at age 30 through age 65 or 70 with a 3  consecutive normal Pap tests. Testing can be stopped between 65 and 70 with 3 consecutive normal Pap tests and no abnormal Pap or HPV tests in the past 10 years.  HPV screening.** / Every 3 years from ages 30 through ages 65 or 70 with a history of 3 consecutive normal Pap tests. Testing can be stopped between 65 and 70 with 3 consecutive normal Pap tests and no abnormal Pap or HPV tests in the past 10 years.  Fecal occult blood test (FOBT) of stool. / Every year beginning at age 50 and continuing until age 75. You may not need to do this test if you get a colonoscopy every 10 years.  Flexible sigmoidoscopy or colonoscopy.** / Every 5 years for a flexible sigmoidoscopy or every 10 years for a colonoscopy beginning at age 50 and continuing until age 75.  Hepatitis C blood test.** / For all people born from 1945 through 1965 and any individual with known risks for hepatitis C.  Osteoporosis screening.** / A one-time screening for women ages 65 and over and women at risk for fractures or osteoporosis.  Skin self-exam. / Monthly.  Influenza immunization.** / Every year.  Pneumococcal polysaccharide immunization.** / 1 dose at age 65 (or older) if you have never been vaccinated.  Tetanus, diphtheria, pertussis (Tdap, Td) immunization. / A one-time dose of Tdap vaccine if you are over   65 and have contact with an infant, are a healthcare worker, or simply want to be protected from whooping cough. After that, you need a Td booster dose every 10 years.  Varicella immunization.** / Consult your caregiver.  Meningococcal immunization.** / Consult your caregiver.  Hepatitis A immunization.** / Consult your caregiver. 2 doses, 6 to 18 months apart.  Hepatitis B immunization.** / Check with your caregiver. 3 doses, usually over 6 months. ** Family history and personal history of risk and conditions may change your caregiver's recommendations. Document Released: 01/19/2002 Document Revised: 02/15/2012  Document Reviewed: 04/20/2011 [employer] Patient Information 2014 [employer].  

## 2013-09-04 LAB — POCT URINALYSIS DIPSTICK
Blood, UA: NEGATIVE
Nitrite, UA: NEGATIVE
Protein, UA: NEGATIVE
Spec Grav, UA: 1.02
Urobilinogen, UA: 0.2

## 2016-01-06 ENCOUNTER — Ambulatory Visit (INDEPENDENT_AMBULATORY_CARE_PROVIDER_SITE_OTHER): Payer: No Typology Code available for payment source | Admitting: Medical

## 2016-01-06 ENCOUNTER — Encounter: Payer: Self-pay | Admitting: Medical

## 2016-01-06 VITALS — BP 128/86 | HR 88 | Temp 98.4°F | Ht 66.0 in | Wt 267.0 lb

## 2016-01-06 DIAGNOSIS — R05 Cough: Secondary | ICD-10-CM | POA: Diagnosis not present

## 2016-01-06 DIAGNOSIS — J029 Acute pharyngitis, unspecified: Secondary | ICD-10-CM | POA: Diagnosis not present

## 2016-01-06 DIAGNOSIS — J0100 Acute maxillary sinusitis, unspecified: Secondary | ICD-10-CM

## 2016-01-06 DIAGNOSIS — R059 Cough, unspecified: Secondary | ICD-10-CM

## 2016-01-06 DIAGNOSIS — H109 Unspecified conjunctivitis: Secondary | ICD-10-CM

## 2016-01-06 MED ORDER — BENZONATATE 100 MG PO CAPS: 100.0000 mg | ORAL_CAPSULE | Freq: Three times a day (TID) | ORAL | Status: AC | PRN

## 2016-01-06 MED ORDER — TOBRAMYCIN 0.3 % OP SOLN: 2.0000 [drp] | Freq: Four times a day (QID) | OPHTHALMIC | Status: AC

## 2016-01-06 MED ORDER — AZITHROMYCIN 250 MG PO TABS: ORAL_TABLET | ORAL | Status: AC

## 2016-01-06 MED ORDER — FLUTICASONE PROPIONATE 50 MCG/ACT NA SUSP: 2.0000 | Freq: Every day | NASAL | Status: AC

## 2016-01-06 NOTE — Progress Notes (Signed)
Subjective:    Patient ID: Kelsey Munoz, female    DOB: Jan 20, 1986, 30 y.o.   MRN: [id number]  HPI  Pt has st, fever, nasal congestoin, sinus pressure and chest congestion(for 9 days) . Some productive cough for 3 days.  Pt had some body aches on and off for entire 9 days. But bodyaches have decreased.  Pt states some family member in her fiancee family sick.  Pt rt eye has some redness and dc since this am. ST was much worse in beginning. And still present now.  LMP- December 21, 2015.  Pt has tried dayquil and nyquil at onset of illness.  Review of Systems  Constitutional: Positive for fever and fatigue. Negative for chills.  HENT: Positive for congestion and sore throat. Negative for postnasal drip and rhinorrhea.   Respiratory: Positive for cough. Negative for choking, chest tightness, shortness of breath and wheezing.   Cardiovascular: Negative for chest pain and palpitations.  Gastrointestinal: Negative for nausea, abdominal pain and diarrhea.  Musculoskeletal: Positive for myalgias. Negative for back pain and arthralgias.  Skin: Negative for rash.  Neurological: Negative for dizziness, facial asymmetry, weakness, numbness and headaches.  Hematological: Negative for adenopathy. Does not bruise/bleed easily.  Psychiatric/Behavioral: Negative for behavioral problems.    Past Medical History  Diagnosis Date  . Anemia     Social History   Social History  . Marital Status: Single    Spouse Name: N/A  . Number of Children: N/A  . Years of Education: N/A   Occupational History  . 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 Smoking status: Never Smoker   . Smokeless tobacco: Not on file  . Alcohol Use: No  . Drug Use: No  . Sexual Activity: No   Other Topics Concern  . Not on file   Social History Narrative   Exercise-- walking 5 miles a day at work    No past surgical history on file.  Family History  Problem Relation  Age of Onset  . Hypertension Mother   . Diabetes Mother   . Diabetes Father   . Hyperlipidemia Father   . Hypertension Father     No Known Allergies  Current Outpatient Prescriptions on File Prior to Visit  Medication Sig Dispense Refill  . Multiple Vitamin (MULTIVITAMIN) tablet Take 1 tablet by mouth daily.     No current facility-administered medications on file prior to visit.    BP 128/86 mmHg  Pulse 88  Temp(Src) 98.4 F (36.9 C) (Oral)  Ht  (1.676 m)  Wt 267 lb (121.11 kg)  BMI 43.12 kg/m2  SpO2 98%       Objective:   Physical Exam  General  Mental Status - Alert. General Appearance - Well groomed. Not in acute distress.  Skin Rashes- No Rashes.  HEENT Head- Normal.    Ear Auditory Canal - Left- Normal. Right - Normal.Tympanic Membrane- Left- Normal. Right- Normal. Eye Sclera/Conjunctiva- Left- Normal. Right- conjunctival redness. No discharge. Nose & Sinuses Nasal Mucosa- Left-  Boggy and Congested. Right-  Boggy and  Congested.Bilateral maxillary and frontal sinus pressure. Mouth & Throat Lips: Upper Lip- Normal: no dryness, cracking, pallor, cyanosis, or vesicular eruption. Lower Lip-Normal: no dryness, cracking, pallor, cyanosis or vesicular eruption. Buccal Mucosa- Bilateral- No Aphthous ulcers. Oropharynx- No Discharge or Erythema. Tonsils: Characteristics- Bilateral- moderate  Erythema and  Congestion. Size/Enlargement- Bilateral- 1+  enlargement. Discharge- bilateral-None.  Neck Neck- Supple. No Masses. Mild  submandibular nodes enlarged.   Chest and Lung Exam Auscultation: Breath Sounds:-Clear even and unlabored.  Cardiovascular Auscultation:Rythm- Regular, rate and rhythm. Murmurs & Other Heart Sounds:Ausculatation of the heart reveal- No Murmurs.  Lymphatic Head & Neck General Head & Neck Lymphatics: Bilateral: Description- No Localized lymphadenopathy.      Assessment & Plan:  For sinus infection and possible strep throat  prescribed azithromycin antibiotic. Start antibiotic today.  For conjunctivitis prescription of tobrex eye drops.  For nasal congestion flonase.  For cough benzonatate.  Follow up in 7-10 days pcp or sooner any persisting/worsening signs or symptoms.  Work not offered to pt but declined.  At time of exam/eval we were out of rapid streps.

## 2016-01-06 NOTE — Patient Instructions (Addendum)
For sinus infection and possible strep throat prescribed azithromycin antibiotic. Start antibiotic today.  For conjunctivitis prescription of tobrex eye drops.  For nasal congestion flonase.  For cough benzonatate.  Follow up in 7-10 days pcp or sooner any persisting/worsening signs or symptoms.

## 2016-01-06 NOTE — Progress Notes (Signed)
Pre visit review using our clinic review tool, if applicable. No additional management support is needed unless otherwise documented below in the visit note. 

## 2016-02-05 ENCOUNTER — Emergency Department (HOSPITAL_COMMUNITY): Payer: No Typology Code available for payment source

## 2016-02-05 ENCOUNTER — Emergency Department (HOSPITAL_COMMUNITY)
Admission: EM | Admit: 2016-02-05 | Discharge: 2016-02-05 | Disposition: A | Discharge status: Home / Self Care | Payer: No Typology Code available for payment source | Attending: Emergency Medicine | Admitting: Emergency Medicine

## 2016-02-05 ENCOUNTER — Encounter (HOSPITAL_COMMUNITY): Payer: Self-pay

## 2016-02-05 DIAGNOSIS — Z862 Personal history of diseases of the blood and blood-forming organs and certain disorders involving the immune mechanism: Secondary | ICD-10-CM | POA: Diagnosis not present

## 2016-02-05 DIAGNOSIS — Y9389 Activity, other specified: Secondary | ICD-10-CM | POA: Insufficient documentation

## 2016-02-05 DIAGNOSIS — W260XXA Contact with knife, initial encounter: Secondary | ICD-10-CM | POA: Insufficient documentation

## 2016-02-05 DIAGNOSIS — Z23 Encounter for immunization: Secondary | ICD-10-CM | POA: Insufficient documentation

## 2016-02-05 DIAGNOSIS — Z792 Long term (current) use of antibiotics: Secondary | ICD-10-CM | POA: Diagnosis not present

## 2016-02-05 DIAGNOSIS — Y998 Other external cause status: Secondary | ICD-10-CM | POA: Insufficient documentation

## 2016-02-05 DIAGNOSIS — S61217A Laceration without foreign body of left little finger without damage to nail, initial encounter: Secondary | ICD-10-CM | POA: Insufficient documentation

## 2016-02-05 DIAGNOSIS — Z79899 Other long term (current) drug therapy: Secondary | ICD-10-CM | POA: Diagnosis not present

## 2016-02-05 DIAGNOSIS — Z7951 Long term (current) use of inhaled steroids: Secondary | ICD-10-CM | POA: Diagnosis not present

## 2016-02-05 DIAGNOSIS — S61219A Laceration without foreign body of unspecified finger without damage to nail, initial encounter: Secondary | ICD-10-CM

## 2016-02-05 DIAGNOSIS — Y92009 Unspecified place in unspecified non-institutional (private) residence as the place of occurrence of the external cause: Secondary | ICD-10-CM | POA: Insufficient documentation

## 2016-02-05 MED ORDER — TETANUS-DIPHTH-ACELL PERTUSSIS 5-2.5-18.5 LF-MCG/0.5 IM SUSP
0.5000 mL | Freq: Once | INTRAMUSCULAR | Status: AC
  Administered 2016-02-05: 0.5 mL via INTRAMUSCULAR
  Filled 2016-02-05: qty 0.5

## 2016-02-05 MED ORDER — MUPIROCIN 2 % EX OINT: TOPICAL_OINTMENT | CUTANEOUS | Status: AC

## 2016-02-05 MED ORDER — BACITRACIN ZINC 500 UNIT/GM EX OINT
TOPICAL_OINTMENT | Freq: Once | CUTANEOUS | Status: AC
  Administered 2016-02-05: 1 via TOPICAL
  Filled 2016-02-05: qty 0.9

## 2016-02-05 MED ORDER — LIDOCAINE HCL (PF) 1 % IJ SOLN
5.0000 mL | Freq: Once | INTRAMUSCULAR | Status: AC
  Administered 2016-02-05: 5 mL
  Filled 2016-02-05: qty 5

## 2016-02-05 MED ORDER — ACETAMINOPHEN 325 MG PO TABS
650.0000 mg | ORAL_TABLET | Freq: Once | ORAL | Status: AC
  Administered 2016-02-05: 650 mg via ORAL
  Filled 2016-02-05: qty 2

## 2016-02-05 NOTE — Discharge Instructions (Signed)
Keep wound clean with mild soap and water. Keep area covered with a topical antibiotic ointment and bandage, keep bandage dry, and do not submerge in water for 24 hours. Ice and elevate for additional pain relief and swelling. Alternate between ibuprofen and Tylenol for additional pain relief. Follow up with your primary care doctor or the [facility] in approximately 10 days for wound recheck and suture removal. Monitor area for signs of infection to include, but not limited to: increasing pain, spreading redness, drainage/pus, worsening swelling, or fevers. Return to emergency department for emergent changing or worsening symptoms. ° ° °WOUND CARE °Keep area clean and dry for 24 hours. Do not remove bandage, if applied. °After 24 hours,you should change it at least once a day. Also, change the dressing if it becomes wet or dirty, or as directed by your caregiver.  °Wash the wound with soap and water 2 times a day. Rinse the wound off with water to remove all soap. Pat the wound dry with a clean towel.  °You may shower as usual after the first 24 hours. Do not soak the wound in water until the sutures are removed.  °Once the wound has healed, scarring can be minimized by covering the wound with sunscreen during the day for 1 full year. °Do not apply any ointments or creams to the wound while stitches/staples are in place, as this may cause delayed healing. °Return if you experience any of the following signs of infection: Swelling, redness, pus drainage, streaking, fever >101.0 F °Return if you experience excessive bleeding that does not stop after 15-20 minutes of constant, firm pressure. ° °

## 2016-02-05 NOTE — ED Provider Notes (Signed)
CSN: [id number]     Arrival date & time 02/05/16  0801 History   First MD Initiated Contact with Patient 02/05/16 [id number]     Chief Complaint  Patient presents with  . Extremity Laceration     (Consider location/radiation/quality/duration/timing/severity/associated sxs/prior Treatment) The history is provided by the patient and medical records. No language interpreter was used.   Kelsey Munoz presents to ED for laceration of left 5th digit approx. 1 hour PTA. Patient was cutting an avocado when knife slipped. Bleeding controlled quickly at home. Unknown last tetanus - believes she is due for one. 4/10 pain. Mild numbness of finger tip initially, but improving. Denies other associated symptoms.   Past Medical History  Diagnosis Date  . Anemia    Past Surgical History  Procedure Laterality Date  . Eye surgery    . Fibula fracture surgery Right    Family History  Problem Relation Age of Onset  . Hypertension Mother   . Diabetes Mother   . Diabetes Father   . Hyperlipidemia Father   . Hypertension Father    Social History  Substance Use Topics  . Smoking status: Never Smoker   . Smokeless tobacco: None  . Alcohol Use: No   OB History    No data available     Review of Systems  Constitutional: Negative for fever and chills.  HENT: Negative for congestion and sore throat.   Eyes: Negative for visual disturbance.  Respiratory: Negative for cough.   Cardiovascular: Negative.   Gastrointestinal: Negative for vomiting and abdominal pain.  Genitourinary: Negative for dysuria.  Musculoskeletal: Negative for back pain and neck pain.  Skin: Positive for wound.  Neurological: Negative for headaches.      Allergies  Review of patient's allergies indicates no known allergies.  Home Medications   Prior to Admission medications   Medication Sig Start Date End Date Taking? Authorizing Provider  azithromycin (ZITHROMAX) 250 MG tablet Take 2 tablets by mouth on day 1, followed  by 1 tablet by mouth daily for 4 days. 01/06/16   Ramon Dredge Saguier, PA-C  benzonatate (TESSALON) 100 MG capsule Take 1 capsule (100 mg total) by mouth 3 (three) times daily as needed. 01/06/16   Edward Saguier, PA-C  fluticasone (FLONASE) 50 MCG/ACT nasal spray Place 2 sprays into both nostrils daily. 01/06/16   Ramon Dredge Saguier, PA-C  Multiple Vitamin (MULTIVITAMIN) tablet Take 1 tablet by mouth daily.    Historical Provider, MD  mupirocin ointment (BACTROBAN) 2 % Apply to affected area twice daily. 02/05/16   Chase Picket Ward, PA-C  tobramycin (TOBREX) 0.3 % ophthalmic solution Place 2 drops into the right eye every 6 (six) hours. 01/06/16   Edward Saguier, PA-C   BP 124/57 mmHg  Pulse 67  Temp(Src) 98.5 F (36.9 C) (Oral)  Resp 16  SpO2 100%  LMP 01/15/2016 Physical Exam  Constitutional: She is oriented to person, place, and time. She appears well-developed and well-nourished.  Alert and in no acute distress  HENT:  Head: Normocephalic and atraumatic.  Cardiovascular: Normal rate, regular rhythm and normal heart sounds.  Exam reveals no gallop and no friction rub.   No murmur heard. Pulmonary/Chest: Effort normal and breath sounds normal. No respiratory distress.  Abdominal: Soft. She exhibits no distension. There is no tenderness.  Musculoskeletal: Normal range of motion.  Neurological: She is alert and oriented to person, place, and time.  Skin:  1.5 cm lac of left 5th digit. No surrounding erythema. Full ROM. Sensation intact.  Nursing note and vitals reviewed.   ED Course  Procedures (including critical care time)  LACERATION REPAIR Performed by: Chase Picket Ward  Authorized by: Chase Picket Ward Patient identity confirmed: provided demographic data Consent: Verbal consent obtained. Consent given by: patient  Risks and benefits: risks, benefits and alternatives were discussed Prepped and Draped in normal sterile fashion Wound explored Body area: Left hand Location  details: 5th digit Laceration length: 1.5 cm Foreign bodies: No foreign bodies seen or palpated. Tendon involvement: none Nerve involvement: none Vascular damage: none Anesthesia: local infiltration, digital block Local anesthetic: lidocaine 1%  Anesthetic total: 5 ml Patient sedated: no Irrigation solution: saline Irrigation method: syringe Amount of cleaning: standard Skin closure: 3-0 Number of sutures: 3 Technique: simple interrupted. Approximation: close Dressing: antibiotic ointment Patient tolerance: Patient tolerated the procedure well with no immediate complications  Labs Review Labs Reviewed - No data to display  Imaging Review Dg Finger Little Left  02/05/2016  CLINICAL DATA:  Laceration left fifth digit. EXAM: LEFT LITTLE FINGER 2+V COMPARISON:  None. FINDINGS: Soft tissue defect noted in the mid left little finger. Small defect is noted in the underlying cortex without a fracture extending across the shaft of the middle phalanx. No radiopaque foreign body. IMPRESSION: Small cortical defect within the middle phalanx underlying the mid left little finger soft tissue laceration. A fracture does not extend across the entire bone. Electronically Signed   By: Charlett Nose M.D.   On: 02/05/2016 09:28   I have personally reviewed and evaluated these images and lab results as part of my medical decision-making.   EKG Interpretation None      MDM   Final diagnoses:  Finger laceration, initial encounter   Kelsey Munoz presents for laceration of left 5th digit. X-ray without fracture or foreign bodies. Lac repaired as dictated above. Rx for ABX ointment given. Home care instructions given. Return precautions and follow up instructions for suture removal given.    [facility] Ward, PA-C 02/05/16 1005  Lavera Guise, MD 02/05/16 1900

## 2016-02-05 NOTE — ED Notes (Signed)
Pt c/o laceration to L 5th digit.  Pain score 4/10.  Pt reports that she was attempting to cut an avocado when the knife slipped.  Bleeding is controlled.

## 2016-02-15 ENCOUNTER — Emergency Department (INDEPENDENT_AMBULATORY_CARE_PROVIDER_SITE_OTHER)
Admission: EM | Admit: 2016-02-15 | Discharge: 2016-02-15 | Disposition: A | Discharge status: Home / Self Care | Payer: No Typology Code available for payment source | Source: Home / Self Care

## 2016-02-15 ENCOUNTER — Encounter (HOSPITAL_COMMUNITY): Payer: Self-pay | Admitting: Nurse Practitioner

## 2016-02-15 DIAGNOSIS — Z4802 Encounter for removal of sutures: Secondary | ICD-10-CM

## 2016-02-15 NOTE — Discharge Instructions (Signed)

## 2016-02-15 NOTE — ED Notes (Signed)
Pt here for [facility]y finger[facility] states the site seems to be healing well and she hasnt noticed any signs of infection

## 2016-02-15 NOTE — ED Provider Notes (Signed)
CSN: [id number]     Arrival date & time 02/15/16  1545 History   None    Chief Complaint  Patient presents with  . Suture / Staple Removal   (Consider location/radiation/quality/duration/timing/severity/associated sxs/prior Treatment) Patient is a 30 y.o. female presenting with suture removal. The history is provided by the patient.  Suture / Staple Removal This is a new problem. The current episode started more than 1 week ago. The problem occurs constantly. The problem has not changed since onset.   Past Medical History  Diagnosis Date  . Anemia    Past Surgical History  Procedure Laterality Date  . Eye surgery    . Fibula fracture surgery Right    Family History  Problem Relation Age of Onset  . Hypertension Mother   . Diabetes Mother   . Diabetes Father   . Hyperlipidemia Father   . Hypertension Father    Social History  Substance Use Topics  . Smoking status: Never Smoker   . Smokeless tobacco: None  . Alcohol Use: No   OB History    No data available     Review of Systems  Constitutional: Negative.   HENT: Negative.   Respiratory: Negative.   Cardiovascular: Negative.   Gastrointestinal: Negative.   Endocrine: Negative.   Genitourinary: Negative.   Musculoskeletal: Negative.   Skin: Positive for wound.  Allergic/Immunologic: Negative.   Neurological: Negative.   Hematological: Negative.   Psychiatric/Behavioral: Negative.     Allergies  Review of patient's allergies indicates no known allergies.  Home Medications   Prior to Admission medications   Medication Sig Start Date End Date Taking? Authorizing Provider  azithromycin (ZITHROMAX) 250 MG tablet Take 2 tablets by mouth on day 1, followed by 1 tablet by mouth daily for 4 days. 01/06/16   Ramon DredgeEdward Saguier, PA-C  benzonatate (TESSALON) 100 MG capsule Take 1 capsule (100 mg total) by mouth 3 (three) times daily as needed. 01/06/16   Edward Saguier, PA-C  fluticasone (FLONASE) 50 MCG/ACT nasal spray  Place 2 sprays into both nostrils daily. 01/06/16   Ramon DredgeEdward Saguier, PA-C  Multiple Vitamin (MULTIVITAMIN) tablet Take 1 tablet by mouth daily.    Historical Provider, MD  mupirocin ointment (BACTROBAN) 2 % Apply to affected area twice daily. 02/05/16   Chase PicketJaime Pilcher Ward, PA-C  tobramycin (TOBREX) 0.3 % ophthalmic solution Place 2 drops into the right eye every 6 (six) hours. 01/06/16   Esperanza RichtersEdward Saguier, PA-C   Meds Ordered and Administered this Visit  Medications - No data to display  BP 118/80 mmHg  Pulse 66  Temp(Src) 97.4 F (36.3 C) (Oral)  SpO2 99%  LMP 01/15/2016 No data found.   Physical Exam  Constitutional: She appears well-developed and well-nourished.  HENT:  Head: Normocephalic and atraumatic.  Right Ear: External ear normal.  Left Ear: External ear normal.  Mouth/Throat: Oropharynx is clear and moist.  Eyes: Conjunctivae and EOM are normal. Pupils are equal, round, and reactive to light.  Neck: Normal range of motion. Neck supple.  Cardiovascular: Normal rate, regular rhythm and normal heart sounds.   Pulmonary/Chest: Effort normal and breath sounds normal.  Skin:  Left 4th finger with 3 nylon sutures well approximating laceration    ED Course  Procedures (including critical care time)  Labs Review Labs Reviewed - No data to display  Imaging Review No results found.   Visual Acuity Review  Right Eye Distance:   Left Eye Distance:   Bilateral Distance:    Right Eye Near:  Left Eye Near:    Bilateral Near:         MDM  Suture removal left 4th finger\  3 nylon sutures removed    3 nylon sutures removed.      Deatra Canter, FNP 02/15/16 [phone]

## 2016-12-14 IMAGING — CR DG FINGER LITTLE 2+V*L*
3 series · 3 of 3 positions shown · non-contrast
Comparison: None.

CLINICAL DATA: Laceration left fifth digit.

EXAM:
LEFT LITTLE FINGER 2+V

[x finger pa left]
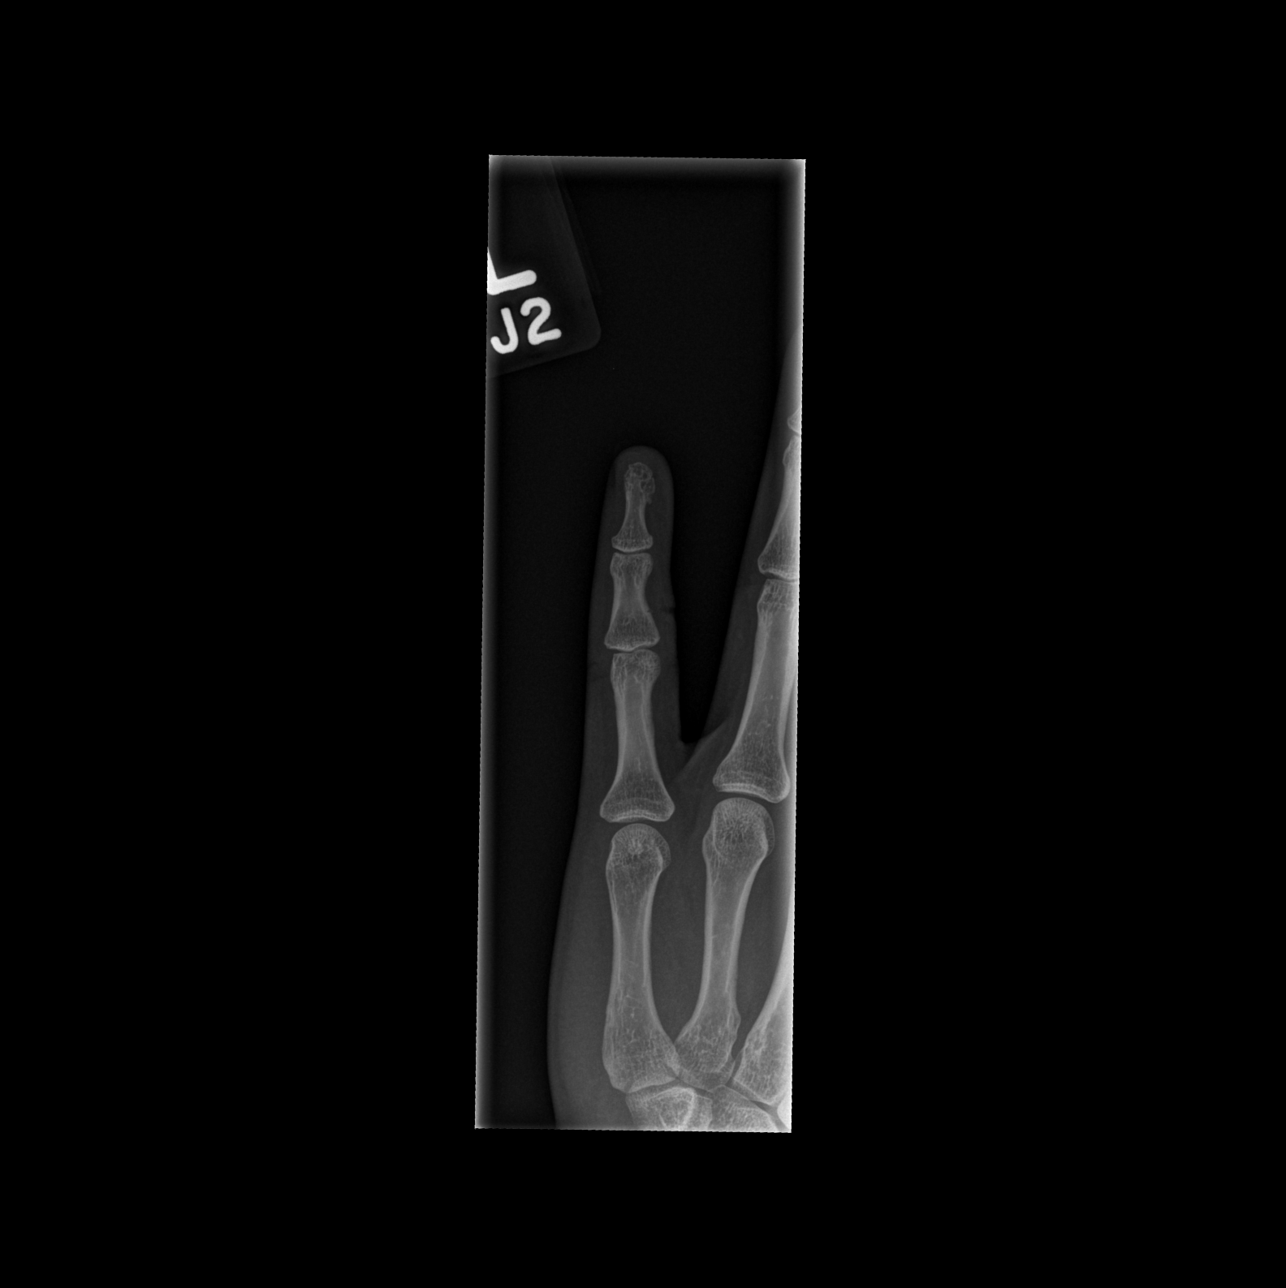

[x finger obl left]
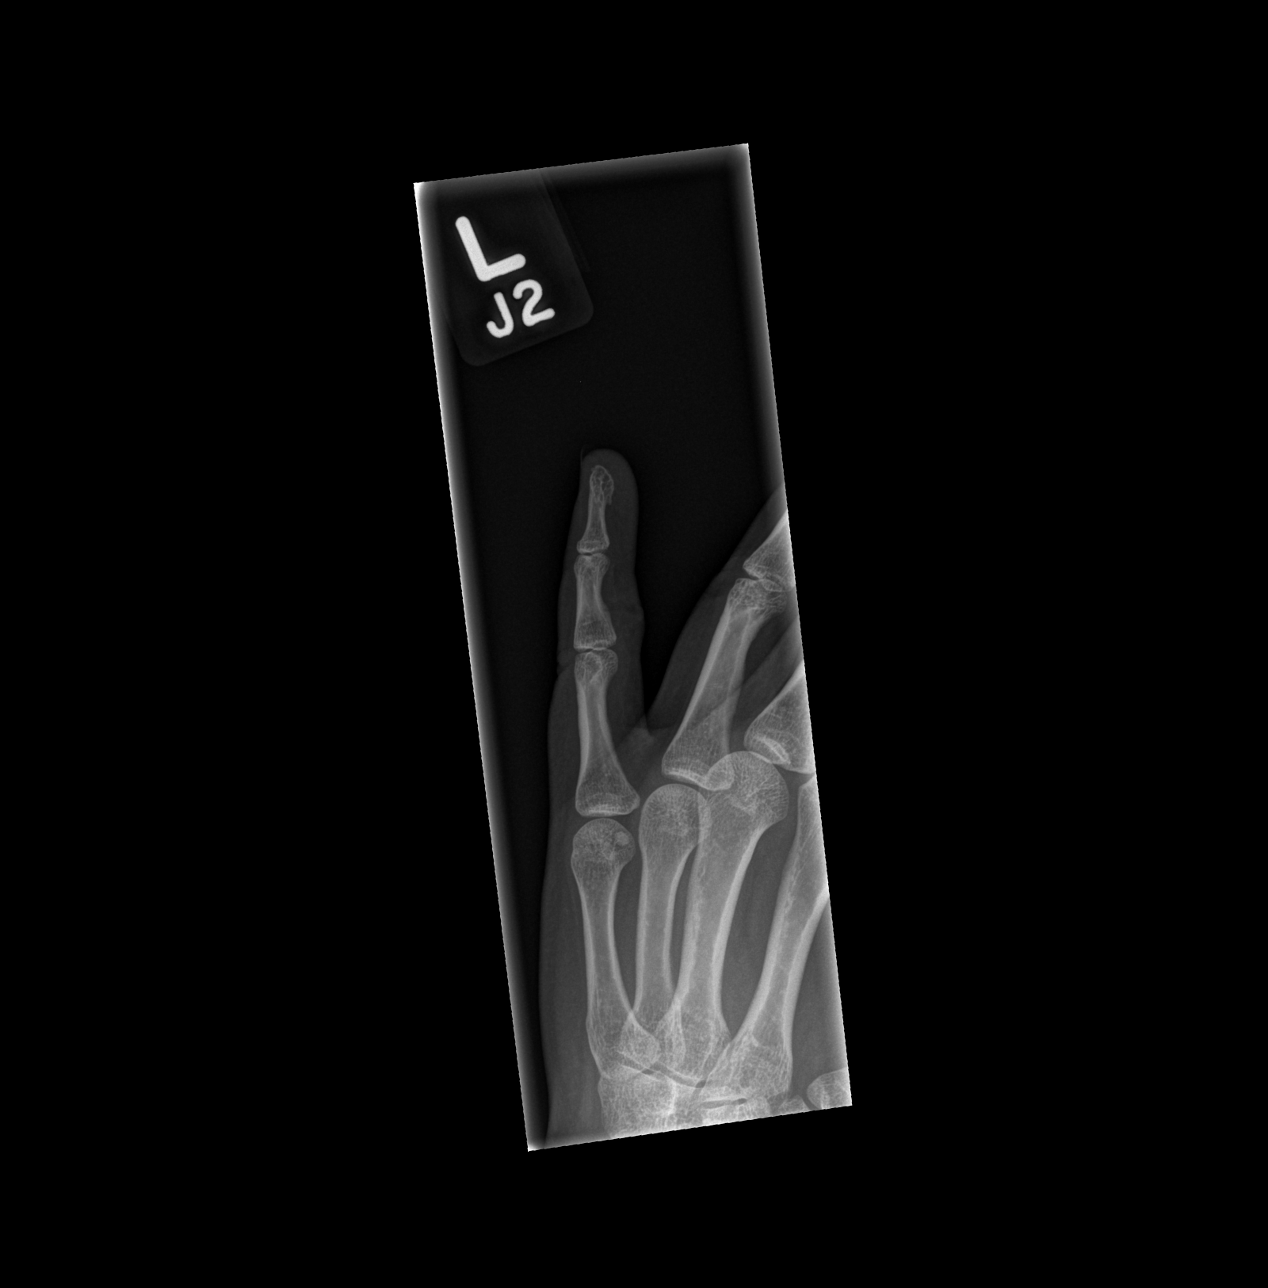

[x finger lat left]
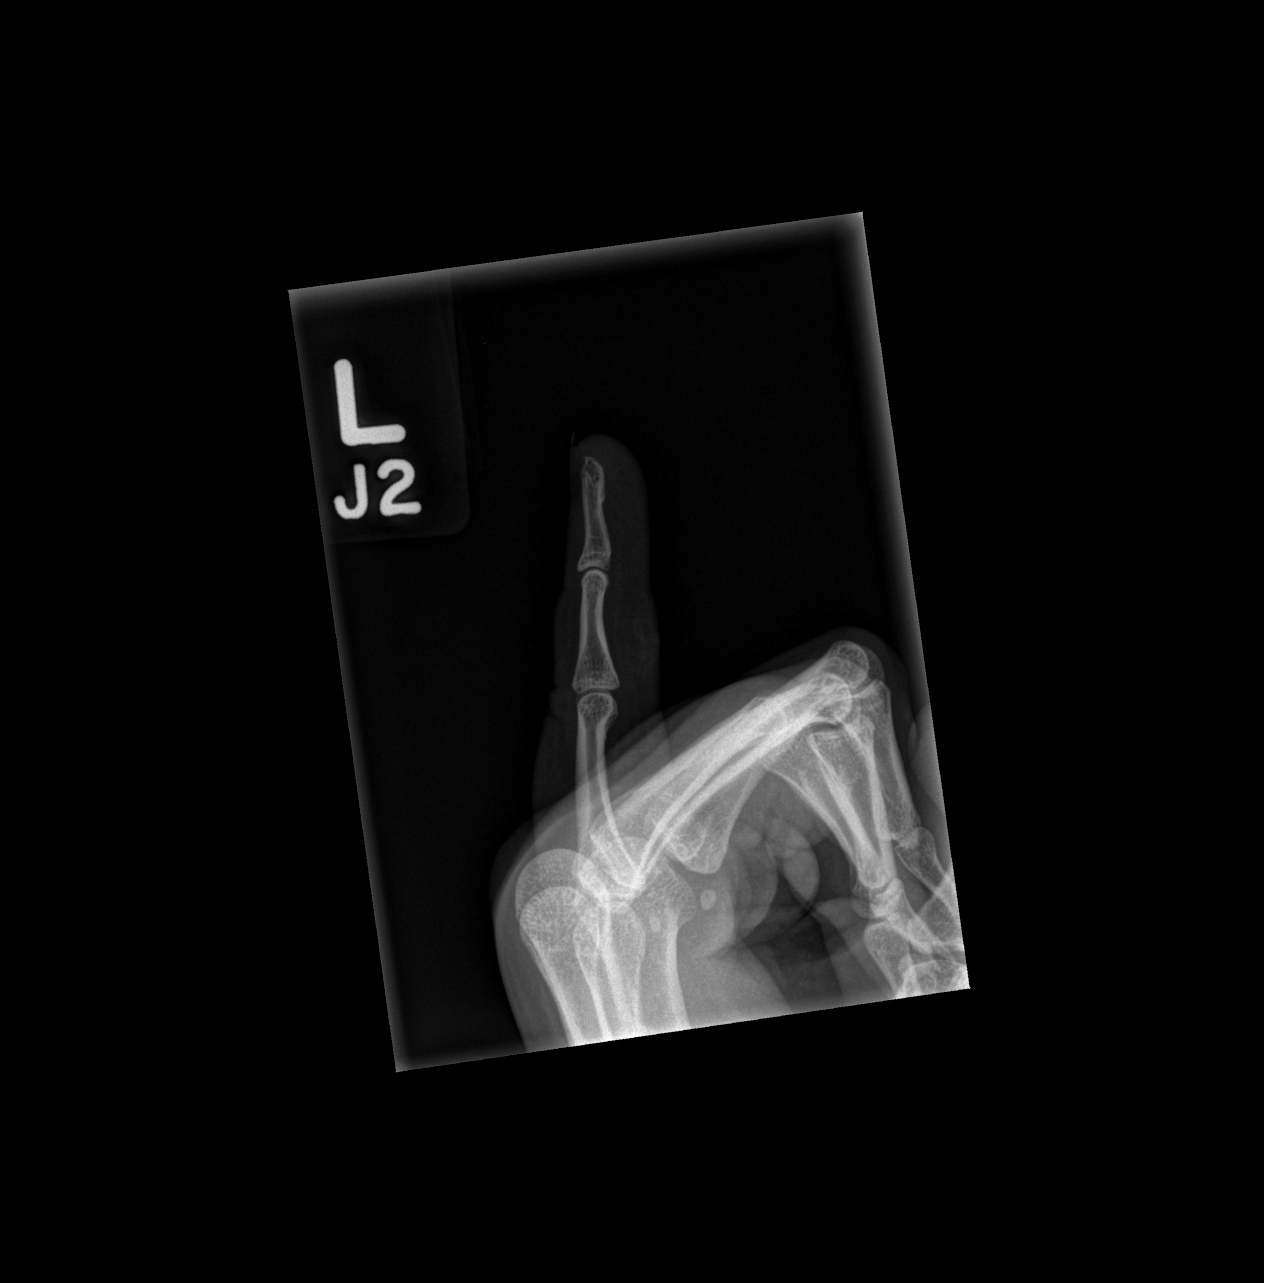

[3 of 3 positions shown; findings below may reference images not displayed]

FINDINGS: Soft tissue defect noted in the mid left little finger. Small defect
is noted in the underlying cortex without a fracture extending
across the shaft of the middle phalanx. No radiopaque foreign body.
IMPRESSION: Small cortical defect within the middle phalanx underlying the mid
left little finger soft tissue laceration. A fracture does not
extend across the entire bone.

## 2019-03-10 ENCOUNTER — Telehealth: Payer: Self-pay | Admitting: *Deleted

## 2019-03-10 NOTE — Telephone Encounter (Signed)
Left message on machine to see if patient would like to schedule follow up via virtual visit.
